# Patient Record
Sex: Female | Born: 1945 | Race: White | Hispanic: No | Smoking: Former smoker
Health system: Southern US, Community
[De-identification: ages and names within clinical notes are randomized; demographics above are authoritative.]

## PROBLEM LIST (undated history)

## (undated) DIAGNOSIS — I10 Essential (primary) hypertension: Secondary | ICD-10-CM

## (undated) DIAGNOSIS — I4891 Unspecified atrial fibrillation: Secondary | ICD-10-CM

## (undated) DIAGNOSIS — F32A Depression, unspecified: Secondary | ICD-10-CM

## (undated) DIAGNOSIS — F1010 Alcohol abuse, uncomplicated: Secondary | ICD-10-CM

## (undated) DIAGNOSIS — I6523 Occlusion and stenosis of bilateral carotid arteries: Secondary | ICD-10-CM

## (undated) DIAGNOSIS — I509 Heart failure, unspecified: Secondary | ICD-10-CM

## (undated) DIAGNOSIS — J449 Chronic obstructive pulmonary disease, unspecified: Secondary | ICD-10-CM

## (undated) DIAGNOSIS — E039 Hypothyroidism, unspecified: Secondary | ICD-10-CM

## (undated) DIAGNOSIS — F329 Major depressive disorder, single episode, unspecified: Secondary | ICD-10-CM

## (undated) DIAGNOSIS — E785 Hyperlipidemia, unspecified: Secondary | ICD-10-CM

## (undated) HISTORY — DX: Unspecified atrial fibrillation: I48.91

## (undated) HISTORY — DX: Alcohol abuse, uncomplicated: F10.10

## (undated) HISTORY — DX: Hyperlipidemia, unspecified: E78.5

## (undated) HISTORY — DX: Essential (primary) hypertension: I10

## (undated) HISTORY — DX: Depression, unspecified: F32.A

## (undated) HISTORY — DX: Occlusion and stenosis of bilateral carotid arteries: I65.23

## (undated) HISTORY — DX: Major depressive disorder, single episode, unspecified: F32.9

## (undated) HISTORY — DX: Hypothyroidism, unspecified: E03.9

## (undated) HISTORY — DX: Heart failure, unspecified: I50.9

---

## 2018-05-16 DIAGNOSIS — F32A Depression, unspecified: Secondary | ICD-10-CM | POA: Insufficient documentation

## 2018-05-16 DIAGNOSIS — I482 Chronic atrial fibrillation, unspecified: Secondary | ICD-10-CM | POA: Insufficient documentation

## 2018-05-16 DIAGNOSIS — E782 Mixed hyperlipidemia: Secondary | ICD-10-CM | POA: Insufficient documentation

## 2018-05-16 DIAGNOSIS — F1010 Alcohol abuse, uncomplicated: Secondary | ICD-10-CM | POA: Insufficient documentation

## 2018-05-16 DIAGNOSIS — E039 Hypothyroidism, unspecified: Secondary | ICD-10-CM | POA: Insufficient documentation

## 2018-05-16 DIAGNOSIS — I6523 Occlusion and stenosis of bilateral carotid arteries: Secondary | ICD-10-CM | POA: Insufficient documentation

## 2018-05-16 DIAGNOSIS — I10 Essential (primary) hypertension: Secondary | ICD-10-CM | POA: Insufficient documentation

## 2018-05-16 DIAGNOSIS — F329 Major depressive disorder, single episode, unspecified: Secondary | ICD-10-CM | POA: Insufficient documentation

## 2018-05-16 DIAGNOSIS — I5022 Chronic systolic (congestive) heart failure: Secondary | ICD-10-CM | POA: Insufficient documentation

## 2018-06-17 ENCOUNTER — Ambulatory Visit: Payer: Self-pay | Admitting: Family Medicine

## 2018-08-21 DIAGNOSIS — Z9889 Other specified postprocedural states: Secondary | ICD-10-CM | POA: Insufficient documentation

## 2018-08-21 DIAGNOSIS — R0602 Shortness of breath: Secondary | ICD-10-CM | POA: Insufficient documentation

## 2018-11-04 NOTE — Progress Notes (Addendum)
[facility] [place] [facility] Consultation      Assessment and Plan:  COPD.  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  There is some degree of reversibility as well. - Not using her nebulizers as prescribed, recommended that she use budesonide nebs twice daily, duo nebs 3 times daily.  Dypsnea on exertion.  - Multifactorial due to COPD, heart failure, obesity, deconditioning. - We will refer to pulmonary rehab.  Chronic hypoxic respiratory failure.  - Currently on oxygen. - Patient has baseline oxygen saturation of about 89% with significant desats on ambulation, continue ambulatory oxygen.  She Artie has her own antigen machine, and is not interested in starting oxygen through home care company due to the cost.  Excessive daytime sleepiness.  -Symptoms and signs of obstructive sleep apnea, and atrial fibrillation, essential hypertension. - We will send for sleep study.   Orders Placed This Encounter  Procedures  . AMB referral to pulmonary rehabilitation  . Split night study   Return in about 3 months (around 02/04/2019).   Date: 11/05/2018  MRN# [id number] Christina Lozano 06-May-1946  Referring Physician: Self referral for dyspnea.   Christina Lozano is a 72 y.o. old female seen in consultation for chief complaint of:    Chief Complaint  Patient presents with  . Consult    Former Dr.Fleming patient:  . Shortness of Breath    pt c/o sob for the last 5 days. She is on 2 liter 02 qhs. She has had to use 02 more the last week with exertion.  . Cough    non productive    HPI:    The patient is a 72 year old female, ex-smoker.  She saw Dr. Meredeth Ide in the office on 08/21/2018.  It was noted that she had flown to [place] for a wedding, while there she became short of breath went to the ED, and was told that she was having a COPD exacerbation.  Upon return she was admitted to the hospital again with pulmonary edema, status post cardiac catheterization with  no coronary artery disease noted. She sees Dr. Liborio Nixon at [facility] she has a history of heart failure, atrial fibrillation, sick sinus syndrome.  She was recommended to be on diuretics, low-sodium diet. She had a PFT which showed moderately severe COPD with FEV1 of 54%, however this improved to 68% with bronchodilator.  She is here today because she has been having difficulty with breathing, she is oxygen at night and suspect that she needs it during the day. She was diagnosed about 6-7 years ago when she was having trouble breathing. She was started on inhalers back then, and she was started on oxygen at night. She used to smoke, 2 ppd, last smoked about 12 years ago.  She has noticed that over the past 5 days that her breathing has been getting worse, her son is present and notes that she is always winded. When goes shopping she check her oxygen because of dyspnea. She is using nebs, but often forgets, she does duonebs 3 times per day and budesonide bid. She thinks that she helps with breathing somewhat.  She was prescribed Anoro but it was too expensive.   She is sleepy much of the day and naps daily. She snores loudly. She can sleep for up to 12 hours and still feels sleepy.   **desat walk 11/05/18>> at rest on RA sat is 87% and HR 81.    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  She also has moderate to severe COPD with obesity. **Review of outside PFT 06/11/2018 >>FVC is 76% predicted, FEV1 is 54% predicted, ratio is 55%.  There is significant improvement with bronchodilator therapy in the FEV1.  Vital capacity is 70%, TLC 75%, ratio is within normal limits.  DLCO is reduced at 53%.  Overall this shows moderate emphysema with reversibility and restrictive lung defect. **Outside Echocardiogram 06/09/2018>> moderate LVH with EF 50%. Total time spent review of non-face to face  documents is 35 min.    Review of outside lab results Gavin Potters([facility]). **Chemistry panel 05/16/2018>> CO2 35. **CBC 05/16/2018>> absolute eosinophil count 340. **SPECT 06/12/2018>> EF equals 57%. **Echo cardiogram>> 06/09/2018>> EF equals 50%.  RVSP 35. **PFT 06/11/2018>> Diagnostics: SPIROMETRY: FVC was 1.95 liters, 76% of predicted/Post 1.89, 74%, -3% Change FEV1 was 1.08, 54% of predicted/Post 1.36, 68%, 26% Change FEV1 ratio was 55/Post 72 FEF 25-75% liters per second was 19% of predicted/Post 41%, 116% Change SMALL VOLUME NEBULIZER given with 2.5 mg Albuterol for Post Spirometry  LUNG VOLUMES: TLC was 75% of predicted RV was 74% of predicted  DIFFUSION CAPACITY: DLCO was 53% of predicted DLCO/VA was 100% of predicted  FLOW VOLUME LOOP: C/w obstruction  Impression Spirometry is c/w moderate - severe copd/obstruction TLC is mildly decreased DLCO is moderate - severely decreased     PMHX:   Atrial Fibrillation, essential hypertension.  Surgical Hx:  Oophorectomy, left CEA.  Family Hx:  Mother with COPD, alcoholism. Biological father not known.  Social Hx:   Social History   Tobacco Use  . Smoking status: Former Smoker    Packs/day: 1.50    Years: 25.00    Pack years: 37.50    Last attempt to quit: 01/03/2006    Years since quitting: 12.8  . Smokeless tobacco: Never Used  Substance Use Topics  . Alcohol use: Yes  . Drug use: Never   Medication:    Current Outpatient Medications:  .  albuterol (PROVENTIL HFA;VENTOLIN HFA) 108 (90 Base) MCG/ACT inhaler, Inhale 2 puffs into the lungs as needed., Disp: , Rfl:  .  apixaban (ELIQUIS) 5 MG TABS tablet, Take 5 mg by mouth 2 (two) times daily., Disp: , Rfl:  .  budesonide (PULMICORT) 0.25 MG/2ML nebulizer solution, Inhale 2 mLs into the lungs 2 (two) times daily., Disp: , Rfl:  .  digoxin (LANOXIN) 0.125 MG tablet, Take 1 tablet by mouth daily., Disp: , Rfl: 11 .  diltiazem (CARDIZEM CD) 300 MG 24 hr capsule, Take  300 mg by mouth daily., Disp: , Rfl: 2 .  furosemide (LASIX) 40 MG tablet, Take 40 mg by mouth as needed., Disp: , Rfl:  .  hydrALAZINE (APRESOLINE) 25 MG tablet, Take 25 mg by mouth daily., Disp: , Rfl: 2 .  ipratropium-albuterol (DUONEB) 0.5-2.5 (3) MG/3ML SOLN, Inhale 3 mLs into the lungs 4 (four) times daily., Disp: , Rfl:  .  losartan (COZAAR) 100 MG tablet, Take 100 mg by mouth daily., Disp: , Rfl:  .  Magnesium Oxide 400 MG CAPS, Take 400 mg by mouth daily., Disp: , Rfl: 1 .  montelukast (SINGULAIR) 10 MG tablet, Take 10 mg by mouth daily., Disp: , Rfl:  .  potassium chloride (K-DUR) 10 MEQ tablet, Take 10 mEq by mouth 2 (two) times daily., Disp: , Rfl:  .  pravastatin (PRAVACHOL) 10 MG tablet, Take 10 mg by mouth at bedtime., Disp: , Rfl:  .  sertraline (ZOLOFT)  50 MG tablet, Take 50 mg by mouth daily., Disp: , Rfl:  .  Thyroid (NATURE-THROID) 48.75 MG TABS, Take 1 tablet by mouth daily., Disp: , Rfl:    Allergies:  Patient has no known allergies.  Review of Systems: Gen:  Denies  fever, sweats, chills HEENT: Denies blurred vision, double vision. bleeds, sore throat Cvc:  No dizziness, chest pain. Resp:   Denies cough or sputum production, shortness of breath Gi: Denies swallowing difficulty, stomach pain. Gu:  Denies bladder incontinence, burning urine Ext:   No Joint pain, stiffness. Skin: No skin rash,  hives  Endoc:  No polyuria, polydipsia. Psych: No depression, insomnia. Other:  All other systems were reviewed with the patient and were negative other that what is mentioned in the HPI.   Physical Examination:   VS: BP 118/76 (BP Location: Left Arm, Cuff Size: Large)   Pulse 95   Resp 16   Ht 5' 0.4" (1.534 m)   Wt 188 lb (85.3 kg)   SpO2 93%   BMI 36.23 kg/m   General Appearance: No distress  Neuro:without focal findings,  speech normal,  HEENT: PERRLA, EOM intact.   Pulmonary: normal breath sounds, No wheezing.  CardiovascularNormal S1,S2.  No m/r/g.     Abdomen: Benign, Soft, non-tender. Renal:  No costovertebral tenderness  GU:  No performed at this time. Endoc: No evident thyromegaly, no signs of acromegaly. Skin:   warm, no rashes, no ecchymosis  Extremities: normal, no cyanosis, clubbing.  Other findings:    LABORATORY PANEL:   CBC No results for input(s): WBC, HGB, HCT, PLT in the last 168 hours. ------------------------------------------------------------------------------------------------------------------  Chemistries  No results for input(s): NA, K, CL, CO2, GLUCOSE, BUN, CREATININE, CALCIUM, MG, AST, ALT, ALKPHOS, BILITOT in the last 168 hours.  Invalid input(s): GFRCGP ------------------------------------------------------------------------------------------------------------------  Cardiac Enzymes No results for input(s): TROPONINI in the last 168 hours. ------------------------------------------------------------  RADIOLOGY:  No results found.     Thank  you for the consultation and for allowing [facility] to assist in the care of your patient. Our recommendations are noted above.  Please contact us if we can be of further service.   Wells Guiles, M.D., F.C.C.P.  Board Certified in Internal Medicine, Pulmonary Medicine, Critical Care Medicine, and Sleep Medicine.  [facility] Office Number: [phone]   11/05/2018

## 2018-11-05 ENCOUNTER — Encounter: Payer: Self-pay | Admitting: Internal Medicine

## 2018-11-05 ENCOUNTER — Ambulatory Visit (INDEPENDENT_AMBULATORY_CARE_PROVIDER_SITE_OTHER): Payer: Federal, State, Local not specified - PPO | Admitting: Internal Medicine

## 2018-11-05 VITALS — BP 118/76 | HR 95 | Resp 16 | Ht 60.4 in | Wt 188.0 lb

## 2018-11-05 DIAGNOSIS — J449 Chronic obstructive pulmonary disease, unspecified: Secondary | ICD-10-CM

## 2018-11-05 DIAGNOSIS — G4719 Other hypersomnia: Secondary | ICD-10-CM | POA: Diagnosis not present

## 2018-11-05 NOTE — Patient Instructions (Addendum)
Remember to take your nebulizer as prescribed.  Continue your own daytime oxygen. Call us if you need a prescription.   Will refer to pulmonary rehab.  Will send for sleep study.

## 2018-11-06 ENCOUNTER — Other Ambulatory Visit: Payer: Self-pay

## 2018-11-06 ENCOUNTER — Telehealth: Payer: Self-pay | Admitting: Internal Medicine

## 2018-11-06 MED ORDER — UMECLIDINIUM-VILANTEROL 62.5-25 MCG/INH IN AEPB: 1.0000 | INHALATION_SPRAY | Freq: Every day | RESPIRATORY_TRACT | 2 refills | Status: AC

## 2018-11-06 NOTE — Telephone Encounter (Signed)
Pt wanted Anoro sent to [facility]. She says it will be cheaper. RX sent Nothing further needed.

## 2018-11-06 NOTE — Telephone Encounter (Signed)
LMTCB  Pt will need to contact her insurance to find out what medication is covered alternative to Anoro. She can then call office back and a different inhaler will be sent to pharmacy.

## 2018-11-06 NOTE — Telephone Encounter (Signed)
Please call regarding an alternative for Anoro. States her insurance is not going to cover it.

## 2018-11-12 ENCOUNTER — Telehealth: Payer: Self-pay | Admitting: Internal Medicine

## 2018-11-12 NOTE — Telephone Encounter (Signed)
Faxed [facility] records to [facility] 11/12/18  LM

## 2018-11-20 NOTE — Telephone Encounter (Signed)
Asher MuirJamie called [facility] in reference to medical records sent over 11/12/18 and wants to know which form of medicine they are intended to be sent to, Cad[facility], Pulm, etc. CB # [phone]

## 2018-11-20 NOTE — Telephone Encounter (Signed)
LMTCB

## 2018-11-21 NOTE — Telephone Encounter (Signed)
Left detailed message for Asher MuirJamie that we have not requested records for this patient. If anything further needed she may call our office. Message closed.

## 2018-11-27 ENCOUNTER — Encounter (HOSPITAL_COMMUNITY): Payer: Self-pay | Admitting: Emergency Medicine

## 2018-11-27 ENCOUNTER — Other Ambulatory Visit: Payer: Self-pay

## 2018-11-27 ENCOUNTER — Inpatient Hospital Stay (HOSPITAL_COMMUNITY)
Admission: EM | Admit: 2018-11-27 | Discharge: 2018-11-28 | DRG: 292 | Disposition: A | Discharge status: Home / Self Care | Payer: Medicare Other | Attending: Student in an Organized Health Care Education/Training Program | Admitting: Student in an Organized Health Care Education/Training Program

## 2018-11-27 ENCOUNTER — Emergency Department (HOSPITAL_COMMUNITY): Payer: Medicare Other

## 2018-11-27 DIAGNOSIS — I509 Heart failure, unspecified: Secondary | ICD-10-CM

## 2018-11-27 DIAGNOSIS — Z7901 Long term (current) use of anticoagulants: Secondary | ICD-10-CM | POA: Diagnosis not present

## 2018-11-27 DIAGNOSIS — E039 Hypothyroidism, unspecified: Secondary | ICD-10-CM

## 2018-11-27 DIAGNOSIS — F1010 Alcohol abuse, uncomplicated: Secondary | ICD-10-CM | POA: Diagnosis present

## 2018-11-27 DIAGNOSIS — I5033 Acute on chronic diastolic (congestive) heart failure: Secondary | ICD-10-CM | POA: Diagnosis present

## 2018-11-27 DIAGNOSIS — F339 Major depressive disorder, recurrent, unspecified: Secondary | ICD-10-CM | POA: Diagnosis not present

## 2018-11-27 DIAGNOSIS — Z87891 Personal history of nicotine dependence: Secondary | ICD-10-CM

## 2018-11-27 DIAGNOSIS — F329 Major depressive disorder, single episode, unspecified: Secondary | ICD-10-CM | POA: Diagnosis present

## 2018-11-27 DIAGNOSIS — J9611 Chronic respiratory failure with hypoxia: Secondary | ICD-10-CM | POA: Diagnosis present

## 2018-11-27 DIAGNOSIS — I6523 Occlusion and stenosis of bilateral carotid arteries: Secondary | ICD-10-CM | POA: Diagnosis present

## 2018-11-27 DIAGNOSIS — J449 Chronic obstructive pulmonary disease, unspecified: Secondary | ICD-10-CM | POA: Diagnosis present

## 2018-11-27 DIAGNOSIS — R0602 Shortness of breath: Secondary | ICD-10-CM | POA: Diagnosis not present

## 2018-11-27 DIAGNOSIS — Z9981 Dependence on supplemental oxygen: Secondary | ICD-10-CM

## 2018-11-27 DIAGNOSIS — R7989 Other specified abnormal findings of blood chemistry: Secondary | ICD-10-CM | POA: Diagnosis not present

## 2018-11-27 DIAGNOSIS — Z7951 Long term (current) use of inhaled steroids: Secondary | ICD-10-CM

## 2018-11-27 DIAGNOSIS — R778 Other specified abnormalities of plasma proteins: Secondary | ICD-10-CM | POA: Diagnosis present

## 2018-11-27 DIAGNOSIS — I110 Hypertensive heart disease with heart failure: Secondary | ICD-10-CM | POA: Diagnosis not present

## 2018-11-27 DIAGNOSIS — Z79899 Other long term (current) drug therapy: Secondary | ICD-10-CM | POA: Diagnosis not present

## 2018-11-27 DIAGNOSIS — E785 Hyperlipidemia, unspecified: Secondary | ICD-10-CM | POA: Diagnosis present

## 2018-11-27 DIAGNOSIS — Z9114 Patient's other noncompliance with medication regimen: Secondary | ICD-10-CM

## 2018-11-27 DIAGNOSIS — Z8701 Personal history of pneumonia (recurrent): Secondary | ICD-10-CM | POA: Diagnosis not present

## 2018-11-27 DIAGNOSIS — I482 Chronic atrial fibrillation, unspecified: Secondary | ICD-10-CM | POA: Diagnosis present

## 2018-11-27 HISTORY — DX: Chronic obstructive pulmonary disease, unspecified: J44.9

## 2018-11-27 LAB — URINALYSIS, ROUTINE W REFLEX MICROSCOPIC
Bilirubin Urine: NEGATIVE
Glucose, UA: NEGATIVE mg/dL
HGB URINE DIPSTICK: NEGATIVE
Ketones, ur: NEGATIVE mg/dL
Leukocytes, UA: NEGATIVE
Nitrite: NEGATIVE
Protein, ur: NEGATIVE mg/dL
Specific Gravity, Urine: 1.004 — ABNORMAL LOW (ref 1.005–1.030)
pH: 7 (ref 5.0–8.0)

## 2018-11-27 LAB — COMPREHENSIVE METABOLIC PANEL
ALK PHOS: 66 U/L (ref 38–126)
ALT: 22 U/L (ref 0–44)
AST: 28 U/L (ref 15–41)
Albumin: 3.7 g/dL (ref 3.5–5.0)
Anion gap: 5 (ref 5–15)
BUN: 19 mg/dL (ref 8–23)
CALCIUM: 9.6 mg/dL (ref 8.9–10.3)
CO2: 33 mmol/L — AB (ref 22–32)
Chloride: 102 mmol/L (ref 98–111)
Creatinine, Ser: 0.89 mg/dL (ref 0.44–1.00)
GFR calc Af Amer: >60 mL/min (ref >60)
GFR calc non Af Amer: >60 mL/min (ref >60)
Glucose, Bld: 90 mg/dL (ref 70–99)
Potassium: 4 mmol/L (ref 3.5–5.1)
Sodium: 140 mmol/L (ref 135–145)
Total Bilirubin: 0.5 mg/dL (ref 0.3–1.2)
Total Protein: 7.1 g/dL (ref 6.5–8.1)

## 2018-11-27 LAB — CBC WITH DIFFERENTIAL/PLATELET
Abs Immature Granulocytes: 0.04 10*3/uL (ref 0.00–0.07)
Basophils Absolute: 0.1 10*3/uL (ref 0.0–0.1)
Basophils Relative: 1 %
Eosinophils Absolute: 0.5 10*3/uL (ref 0.0–0.5)
Eosinophils Relative: 4 %
HCT: 38.7 % (ref 36.0–46.0)
Hemoglobin: 12 g/dL (ref 12.0–15.0)
Immature Granulocytes: 0 %
Lymphocytes Relative: 14 %
Lymphs Abs: 1.6 10*3/uL (ref 0.7–4.0)
MCH: 29.6 pg (ref 26.0–34.0)
MCHC: 31 g/dL (ref 30.0–36.0)
MCV: 95.6 fL (ref 80.0–100.0)
Monocytes Absolute: 1 10*3/uL (ref 0.1–1.0)
Monocytes Relative: 9 %
Neutro Abs: 8 10*3/uL — ABNORMAL HIGH (ref 1.7–7.7)
Neutrophils Relative %: 72 %
Platelets: 360 10*3/uL (ref 150–400)
RBC: 4.05 MIL/uL (ref 3.87–5.11)
RDW: 13.8 % (ref 11.5–15.5)
WBC: 11.2 10*3/uL — ABNORMAL HIGH (ref 4.0–10.5)
nRBC: 0 % (ref 0.0–0.2)

## 2018-11-27 LAB — TROPONIN I: Troponin I: 0.12 ng/mL (ref <0.03)

## 2018-11-27 LAB — BRAIN NATRIURETIC PEPTIDE: B Natriuretic Peptide: 225.3 pg/mL — ABNORMAL HIGH (ref 0.0–100.0)

## 2018-11-27 MED ORDER — SODIUM CHLORIDE 0.9% FLUSH
3.0000 mL | Freq: Two times a day (BID) | INTRAVENOUS | Status: DC
  Administered 2018-11-28: 3 mL via INTRAVENOUS

## 2018-11-27 MED ORDER — FUROSEMIDE 10 MG/ML IJ SOLN
40.0000 mg | Freq: Two times a day (BID) | INTRAMUSCULAR | Status: DC
  Administered 2018-11-28: 40 mg via INTRAVENOUS
  Filled 2018-11-27: qty 4

## 2018-11-27 MED ORDER — ACETAMINOPHEN 650 MG RE SUPP: 650.0000 mg | Freq: Four times a day (QID) | RECTAL | Status: DC | PRN

## 2018-11-27 MED ORDER — MONTELUKAST SODIUM 10 MG PO TABS
10.0000 mg | ORAL_TABLET | Freq: Every day | ORAL | Status: DC
  Administered 2018-11-28: 10 mg via ORAL
  Filled 2018-11-27: qty 1

## 2018-11-27 MED ORDER — APIXABAN 5 MG PO TABS
5.0000 mg | ORAL_TABLET | Freq: Two times a day (BID) | ORAL | Status: DC
  Administered 2018-11-28 (×2): 5 mg via ORAL
  Filled 2018-11-27 (×2): qty 1

## 2018-11-27 MED ORDER — HYDRALAZINE HCL 25 MG PO TABS
25.0000 mg | ORAL_TABLET | Freq: Three times a day (TID) | ORAL | Status: DC
  Administered 2018-11-28 (×3): 25 mg via ORAL
  Filled 2018-11-27 (×3): qty 1

## 2018-11-27 MED ORDER — MAGNESIUM OXIDE 400 (241.3 MG) MG PO TABS
400.0000 mg | ORAL_TABLET | Freq: Every day | ORAL | Status: DC
  Administered 2018-11-28: 400 mg via ORAL
  Filled 2018-11-27: qty 1

## 2018-11-27 MED ORDER — UMECLIDINIUM-VILANTEROL 62.5-25 MCG/INH IN AEPB
1.0000 | INHALATION_SPRAY | Freq: Every day | RESPIRATORY_TRACT | Status: DC
  Administered 2018-11-28: 1 via RESPIRATORY_TRACT
  Filled 2018-11-27: qty 14

## 2018-11-27 MED ORDER — MAGNESIUM OXIDE 400 MG PO CAPS: 400.0000 mg | ORAL_CAPSULE | Freq: Every day | ORAL | Status: DC

## 2018-11-27 MED ORDER — SODIUM CHLORIDE 0.9% FLUSH
3.0000 mL | Freq: Two times a day (BID) | INTRAVENOUS | Status: DC
  Administered 2018-11-28 (×2): 3 mL via INTRAVENOUS

## 2018-11-27 MED ORDER — SENNOSIDES-DOCUSATE SODIUM 8.6-50 MG PO TABS: 1.0000 | ORAL_TABLET | Freq: Every evening | ORAL | Status: DC | PRN

## 2018-11-27 MED ORDER — SODIUM CHLORIDE 0.9% FLUSH: 3.0000 mL | INTRAVENOUS | Status: DC | PRN

## 2018-11-27 MED ORDER — SERTRALINE HCL 50 MG PO TABS
50.0000 mg | ORAL_TABLET | Freq: Every day | ORAL | Status: DC
  Administered 2018-11-28: 50 mg via ORAL
  Filled 2018-11-27: qty 1

## 2018-11-27 MED ORDER — PRAVASTATIN SODIUM 10 MG PO TABS
10.0000 mg | ORAL_TABLET | Freq: Every day | ORAL | Status: DC
  Administered 2018-11-28: 10 mg via ORAL
  Filled 2018-11-27: qty 1

## 2018-11-27 MED ORDER — DILTIAZEM HCL ER COATED BEADS 300 MG PO CP24
300.0000 mg | ORAL_CAPSULE | Freq: Every day | ORAL | Status: DC
  Administered 2018-11-28: 300 mg via ORAL
  Filled 2018-11-27: qty 1

## 2018-11-27 MED ORDER — IPRATROPIUM-ALBUTEROL 0.5-2.5 (3) MG/3ML IN SOLN: 3.0000 mL | RESPIRATORY_TRACT | Status: DC | PRN

## 2018-11-27 MED ORDER — DIGOXIN 125 MCG PO TABS
125.0000 ug | ORAL_TABLET | Freq: Every day | ORAL | Status: DC
  Administered 2018-11-28: 125 ug via ORAL
  Filled 2018-11-27: qty 1

## 2018-11-27 MED ORDER — POTASSIUM CHLORIDE CRYS ER 10 MEQ PO TBCR
10.0000 meq | EXTENDED_RELEASE_TABLET | Freq: Every day | ORAL | Status: DC
  Administered 2018-11-28: 10 meq via ORAL
  Filled 2018-11-27 (×2): qty 1

## 2018-11-27 MED ORDER — SODIUM CHLORIDE 0.9 % IV SOLN: 250.0000 mL | INTRAVENOUS | Status: DC | PRN

## 2018-11-27 MED ORDER — ACETAMINOPHEN 325 MG PO TABS: 650.0000 mg | ORAL_TABLET | Freq: Four times a day (QID) | ORAL | Status: DC | PRN

## 2018-11-27 MED ORDER — FUROSEMIDE 10 MG/ML IJ SOLN
40.0000 mg | Freq: Once | INTRAMUSCULAR | Status: AC
  Administered 2018-11-27: 40 mg via INTRAVENOUS
  Filled 2018-11-27: qty 4

## 2018-11-27 NOTE — H&P (Addendum)
Date: 11/27/2018               Patient Name:  Christina Lozano MRN: [id number]  DOB: 27-Dec-1945 Age / Sex: 72 y.o., female   PCP: Gracelyn Nurse, MD         Medical Service: Internal Medicine Teaching Service         Attending Physician: Dr. Oswaldo Done, Marquita Palms, *    First Contact: Dr. Gwyneth Revels Pager: [phone]  Second Contact: Dr. Frances Furbish Pager: [phone]       After Hours (After 5p/  First Contact Pager: [phone]  weekends / holidays): Second Contact Pager: [phone]   Chief Complaint: Shortness of breath  History of Present Illness: 72 year old female with past medical history of COPD on home oxygen at night, CHF (last echo 7.8 2019) EF: 50%, partial adherence to PRN Lasix), HLD, HTN, hypothyroidism, bilateral carotid artery stenosis, A. Fib (on Eliquis 5 mg daily), presented to ED due to shortness of breath. She mentions that she was hospitalized in [place] 2 weeks ago due to pneumonia, treated with antibiotics. How ever she had gradual worsening of shortness of breath and required O2 during the days sometimes since then. She reports more DOE on the day of admission and when walking to the kitchen and her son brought her to th DE. She denies chest pain. No cough, fever or chills. She endorses bilateral lower extremity edema in past 2 days. No orthopnea. No weight gain. She has been prescribed 40 mg PRN Lasix but usually hesitates to take it due to inconvenience of having to use the bathroom. She is otherwise compliant to her medications and says she has been on low sodium diet. ROS is negative for abdominal pain, diarrhea, constipation, dysuria.  ED course: CXR showed some vascular congestion and she found to have elevated BNP at 225. Also had mild Tropononinemia at 0.12 and EKG showed T inversion on inferolateral leads (and afib). She was given 40 mg IV Lasix. Remained stable and her SOB improved, saturated well on 2 lit nasal O2. IM teaching service then consulted for admission.    Meds:  Current Meds  Medication Sig  . albuterol (PROVENTIL HFA;VENTOLIN HFA) 108 (90 Base) MCG/ACT inhaler Inhale 2 puffs into the lungs every 4 (four) hours as needed for wheezing or shortness of breath.   Marland Kitchen apixaban (ELIQUIS) 5 MG TABS tablet Take 5 mg by mouth 2 (two) times daily.  . budesonide (PULMICORT) 0.25 MG/2ML nebulizer solution Inhale 2 mLs into the lungs 2 (two) times daily as needed (shortness of breath).   . digoxin (LANOXIN) 0.125 MG tablet Take 1 tablet by mouth daily.  Marland Kitchen diltiazem (CARDIZEM CD) 300 MG 24 hr capsule Take 300 mg by mouth daily.  . furosemide (LASIX) 40 MG tablet Take 40 mg by mouth daily.   . hydrALAZINE (APRESOLINE) 25 MG tablet Take 25 mg by mouth every 8 (eight) hours.   Marland Kitchen ipratropium-albuterol (DUONEB) 0.5-2.5 (3) MG/3ML SOLN Inhale 3 mLs into the lungs every 4 (four) hours as needed (short of breath).   . Magnesium Oxide 400 MG CAPS Take 400 mg by mouth daily.  . montelukast (SINGULAIR) 10 MG tablet Take 10 mg by mouth daily.  . potassium chloride (K-DUR) 10 MEQ tablet Take 10 mEq by mouth daily.   . pravastatin (PRAVACHOL) 10 MG tablet Take 10 mg by mouth at bedtime.  . sertraline (ZOLOFT) 50 MG tablet Take 50 mg by mouth at bedtime.   . Thyroid (NATURE-THROID) 48.75  MG TABS Take 1 tablet by mouth daily.  Marland Kitchen. umeclidinium-vilanterol (ANORO ELLIPTA) 62.5-25 MCG/INH AEPB Inhale 1 puff into the lungs daily.     Allergies: Allergies as of 11/27/2018  . (No Known Allergies)   Past Medical History:  Diagnosis Date  . A-fib (HCC)   . Alcohol abuse   . Bilateral carotid artery stenosis   . CHF (congestive heart failure) (HCC)   . COPD (chronic obstructive pulmonary disease) (HCC)   . Depression   . Hyperlipidemia   . Hypertension   . Hypothyroidism     Family History: Mother had lung cancer   Social History:  Former smoker (50 pack year). Quit 14 years ago. Alcohol use: Drinks 2-3 wines per night. No Hx of withdrawal.  Review of  Systems: A complete ROS was negative except as per HPI.   Physical Exam: Blood pressure (!) 153/86, pulse 69, temperature (!) 97.2 F (36.2 C), temperature source Oral, resp. rate 18, height 5\' 3"  (1.6 m), weight 81.6 kg, SpO2 96 %. Physical Exam Nursing note reviewed.  Constitutional:      General: She is not in acute distress.    Appearance: She is well-developed. She is not ill-appearing.  HENT:     Head: Normocephalic and atraumatic.     Mouth/Throat:     Mouth: Mucous membranes are moist.  Eyes:     Extraocular Movements: Extraocular movements intact.     Pupils: Pupils are equal, round, and reactive to light.  Neck:     Vascular: No JVD.  Cardiovascular:     Rate and Rhythm: Normal rate. Rhythm irregular.     Heart sounds: No murmur.   Pulmonary:     Effort: Pulmonary effort is normal. No tachypnea or respiratory distress.     Breath sounds: Examination of the left-lower field reveals Rales  Abdominal:     General: Bowel sounds are normal.     Palpations: Abdomen is soft.     Tenderness: There is no abdominal tenderness.   Musculoskeletal:   Bilateral 1+ pitting Edema till mid tibia present.   Skin:    General: Skin is warm.     Findings: No rash.  Neurological:     General: No focal deficit present.     Mental Status: She is alert and oriented to person, place, and time.  Psychiatric:        Mood and Affect: Mood normal.        Behavior: Behavior normal.     BNP: 225.3 Trop: 0.12 CMP, CBC and U/A unremarkable.   EKG: personally reviewed my interpretation is A-fib, T inversion in Inf lat leads.  CXR: personally reviewed my interpretation is vascular congestion with probable effusion. without pulmonary edema.   Assessment & Plan by Problem: Active Problems:   CHF exacerbation (HCC)  Acute on chronic congestive heart failure: Her gradually worsening SOB in pasr 2 weeks and 2 days Hx of LEE, with evidence of vascular congestion on CXR, and BNP elevation  is likely 2/2 acute on chronic CHF exacerbation. Report partial/non adherence to PRN Lasix.   Non ischemic HF NYHA class 2 last echo 7.8 119, EF: 50%. Myocardial perfusion test 06/09/2018: Nl myocardial perfusion without evidence of myocardial ischemia)  Patient also has Tropononinemia and T inversion on inferolateral leads. ED provider talked to Dr. Delton SeeNelson and he recommended admission for diuresis and  and possible echo and re consult as needed.  Can be due to demand ischemia. Will trend troponin and monitor patient while  diuresing her.  -IV Lasix 40 mg twice daily -Potassium chloride 10 mEq p.o. daily -Strict intake output -Daily weights -Echocardiogram -Trend troponin every 6 hours -Cardiac monitoring and continious pulse oximetry -BMP next a.m. -CBC next a.m. -Administere 2 li  nasal O2 to keep O2 sat between 88-92% during the day and  dminister 3 lit nasal O2 at night.  COPD: Moderate to sever COPD. No evidence of current COPD exacerbation.  Patient is on Pulmicort twice daily, Montelukast 10 mg daily, Anoro Ellipta daily, Albuterol inhaler as needed, DuoNeb every 4 hours as needed.  Also uses night oxygen at home. (Per chart, has not been consistent with home O2 and inhalers.)  Last SPIROMETRY: 06/11/2018: FVC: 76% of predicted/  Post inhaler:74% FEV1: 54% of predicted/ Post inhaler: 68% FEV1 ratio was 55/Post 72  -Montelukast 10 mg tablet daily -Anoro Ellipta 62.5-25 1 puff daily -DuoNeb nebulizer 0.5-2.5 every 4 hours PRN -Administere 2 li  nasal O2 to keep O2 sat between 88-92% during the day. Administer 3 lit nasal O2 at night.   Afib: Denies any worsening of palpitation. Currently A fib, with controlled rate. Has been on Eliquis 5 mg BID and digoxin 125 MCG daily at home.  -Continue home dose of Eliquis 5 mg twice daily -Continue home dose of Digoxin 125 MCG, daily -Cardiac Monitoring  HTN: BP 150s-160s/70s-80s.   We will continue home meds: -Hydralazine 25 mg  p.o. every 8 hours -Diltiazem 300 mg daily  HLD: Bilateral Carotid stenosis: Asymptomatic -Continue home med: Paravastain 10 mg QD  Depression: Stable -Continue home med: Zoloft 50 mg at bedtime  Hypothyroidism: Last TSH normal at 2.13 six months ago. Not on meds.   Diet: Heart healthy IV fluid: None VTE ppx: Eliquis Code status: Full code  Dispo: Admit patient to Inpatient with expected length of stay greater than 2 midnights.  SignedChevis Pretty: Burlon Centrella, MD 11/27/2018, 9:26 PM  Pager: [phone]

## 2018-11-27 NOTE — ED Provider Notes (Signed)
[facility] Note   CSN: [id number] Arrival date & time: 11/27/18  1520     History   Chief Complaint Chief Complaint  Patient presents with  . Shortness of Breath    HPI Christina Lozano is a 72 y.o. female.  HPI  Past Medical History:  Diagnosis Date  . A-fib (HCC)   . Alcohol abuse   . Bilateral carotid artery stenosis   . CHF (congestive heart failure) (HCC)   . COPD (chronic obstructive pulmonary disease) (HCC)   . Depression   . Hyperlipidemia   . Hypertension   . Hypothyroidism     Patient Active Problem List   Diagnosis Date Noted  . S/P cardiac catheterization 08/21/2018  . SOBOE (shortness of breath on exertion) 08/21/2018  . Alcohol abuse, daily use 05/16/2018  . Benign essential HTN 05/16/2018  . Bilateral carotid artery stenosis 05/16/2018  . Chronic a-fib 05/16/2018  . Chronic systolic CHF (congestive heart failure), NYHA class 2 (HCC) 05/16/2018  . Depression, prolonged 05/16/2018  . Hyperlipidemia, mixed 05/16/2018  . Hypothyroidism 05/16/2018    History reviewed. No pertinent surgical history.   OB History   No obstetric history on file.      Home Medications    Prior to Admission medications   Medication Sig Start Date End Date Taking? Authorizing Provider  albuterol (PROVENTIL HFA;VENTOLIN HFA) 108 (90 Base) MCG/ACT inhaler Inhale 2 puffs into the lungs every 4 (four) hours as needed for wheezing or shortness of breath.  11/03/18 11/03/19 Yes [provider]  apixaban (ELIQUIS) 5 MG TABS tablet Take 5 mg by mouth 2 (two) times daily. 10/29/18  Yes [provider]  budesonide (PULMICORT) 0.25 MG/2ML nebulizer solution Inhale 2 mLs into the lungs 2 (two) times daily as needed (shortness of breath).  08/12/17  Yes [provider]  digoxin (LANOXIN) 0.125 MG tablet Take 1 tablet by mouth daily. 11/03/18  Yes [provider]  diltiazem (CARDIZEM CD) 300 MG 24 hr  capsule Take 300 mg by mouth daily. 10/20/18  Yes [provider]  furosemide (LASIX) 40 MG tablet Take 40 mg by mouth daily.  05/16/18  Yes [provider]  hydrALAZINE (APRESOLINE) 25 MG tablet Take 25 mg by mouth every 8 (eight) hours.  09/20/18  Yes [provider]  ipratropium-albuterol (DUONEB) 0.5-2.5 (3) MG/3ML SOLN Inhale 3 mLs into the lungs every 4 (four) hours as needed (short of breath).  11/03/18 10/29/19 Yes [provider]  Magnesium Oxide 400 MG CAPS Take 400 mg by mouth daily. 09/30/18  Yes [provider]  montelukast (SINGULAIR) 10 MG tablet Take 10 mg by mouth daily. 09/30/18  Yes [provider]  potassium chloride (K-DUR) 10 MEQ tablet Take 10 mEq by mouth daily.  05/16/18  Yes [provider]  pravastatin (PRAVACHOL) 10 MG tablet Take 10 mg by mouth at bedtime. 05/16/18  Yes [provider]  sertraline (ZOLOFT) 50 MG tablet Take 50 mg by mouth at bedtime.  05/16/18  Yes [provider]  Thyroid (NATURE-THROID) 48.75 MG TABS Take 1 tablet by mouth daily. 05/16/18  Yes [provider]  umeclidinium-vilanterol (ANORO ELLIPTA) 62.5-25 MCG/INH AEPB Inhale 1 puff into the lungs daily. 11/06/18  Yes Shane Crutchamachandran, Pradeep, MD    Family History History reviewed. No pertinent family history.  Social History Social History   Tobacco Use  . Smoking status: Former Smoker    Packs/day: 1.50    Years: 40.00  Pack years: 60.00    Last attempt to quit: 01/03/2006    Years since quitting: 12.9  . Smokeless tobacco: Never Used  Substance Use Topics  . Alcohol use: Yes  . Drug use: Never     Allergies   Patient has no known allergies.   Review of Systems Review of Systems   Physical Exam Updated Vital Signs BP (!) 163/89 (BP Location: Left Arm)   Pulse 70   Temp (!) 97.2 F (36.2 C) (Oral)   Resp 16   Ht 5\' 3"  (1.6 m)   Wt 81.6 kg   SpO2 97%   BMI 31.89 kg/m   Physical  Exam   ED Treatments / Results  Labs (all labs ordered are listed, but only abnormal results are displayed) Labs Reviewed  CBC WITH DIFFERENTIAL/PLATELET - Abnormal; Notable for the following components:      Result Value   WBC 11.2 (*)    Neutro Abs 8.0 (*)    All other components within normal limits  COMPREHENSIVE METABOLIC PANEL - Abnormal; Notable for the following components:   CO2 33 (*)    All other components within normal limits  TROPONIN I - Abnormal; Notable for the following components:   Troponin I 0.12 (*)    All other components within normal limits  BRAIN NATRIURETIC PEPTIDE - Abnormal; Notable for the following components:   B Natriuretic Peptide 225.3 (*)    All other components within normal limits  URINALYSIS, ROUTINE W REFLEX MICROSCOPIC - Abnormal; Notable for the following components:   Color, Urine COLORLESS (*)    Specific Gravity, Urine 1.004 (*)    All other components within normal limits    EKG EKG Interpretation  Date/Time:  Thursday November 27 2018 15:25:26 EST Ventricular Rate:  82 PR Interval:    QRS Duration: 82 QT Interval:  334 QTC Calculation: 390 R Axis:   20 Text Interpretation:  Atrial fibrillation ST & T wave abnormality, consider inferior ischemia Abnormal ECG Confirmed by Loren RacerYelverton, Gennell How ([id number]) on 11/27/2018 5:12:45 PM   Radiology Dg Chest 2 View  Result Date: 11/27/2018 CLINICAL DATA:  Shortness of breath. Recently hospitalized for pneumonia. History of hypertension, COPD, congestive heart failure and atrial fibrillation. EXAM: CHEST - 2 VIEW COMPARISON:  None available. Report only [facility]. FINDINGS: Mild cardiomegaly with aortic atherosclerosis. There is vascular congestion without overt pulmonary edema or confluent airspace opacity. Both costophrenic angles are blunted posteriorly, suspicious for small bilateral pleural effusions. There are surgical clips in the left neck.  Degenerative changes are present throughout the thoracic spine. IMPRESSION: Mild cardiomegaly with vascular congestion and probable small bilateral pleural effusions. No focal airspace disease. Electronically Signed   By: Carey BullocksWilliam  Veazey M.D.   On: 11/27/2018 16:48    Procedures Procedures (including critical care time)  Medications Ordered in ED Medications  furosemide (LASIX) injection 40 mg (40 mg Intravenous Given 11/27/18 1732)     Initial Impression / Assessment and Plan / ED Course  I have reviewed the triage vital signs and the nursing notes.  Pertinent labs & imaging results that were available during my care of the patient were reviewed by me and considered in my medical decision making (see chart for details).     Patient continues to deny any chest pain.  X-ray with evidence of fluid overload as well as elevation in BNP.  Patient also has an elevated troponin.  EKG with atrial fibrillation and diffuse T wave inversion with  no old EKG for comparison.  Spoke with Dr. Delton See who recommends medicine admit for diuresis and possible echo.  Reconsult as needed.  Internal medicine service to see patient in the emergency department and admit.  Final Clinical Impressions(s) / ED Diagnoses   Final diagnoses:  Acute on chronic congestive heart failure, unspecified heart failure type [facility]  Elevated troponin    ED Discharge Orders    None       Loren Racer, MD 11/27/18 2016

## 2018-11-27 NOTE — ED Triage Notes (Signed)
Pt reports she was admitted several weeks ago for pneumonia. Pt reports she finished her antibiotics but reports she doesn't feel much better. Pt reports she typically wears 3L [place] at night but has needed O2 during the day also. Pt reports she had a cough 1 week ago but no longer has a cough.

## 2018-11-27 NOTE — ED Provider Notes (Signed)
[facility] Provider Note   CSN: [id number] Arrival date & time: 11/27/18  1520     History   Chief Complaint Chief Complaint  Patient presents with  . Shortness of Breath    HPI Christina Lozano is a 72 y.o. female.  HPI Patient has history of CHF, COPD and atrial fibrillation who wears home oxygen at night.  States 2 weeks ago she was diagnosed with pneumonia.  Since that time she has had increased dyspnea with exertion.  She has been wearing her oxygen more often.  States her cough has improved.  Denies any fever or chills.  She denies any chest pain.  She has had bilateral lower extremity swelling.  Patient states she does not take Lasix as prescribed due to inconvenience of having to use the bathroom.  She did take a dose yesterday. Past Medical History:  Diagnosis Date  . A-fib (HCC)   . Alcohol abuse   . Bilateral carotid artery stenosis   . CHF (congestive heart failure) (HCC)   . COPD (chronic obstructive pulmonary disease) (HCC)   . Depression   . Hyperlipidemia   . Hypertension   . Hypothyroidism     Patient Active Problem List   Diagnosis Date Noted  . COPD (chronic obstructive pulmonary disease) (HCC) 11/28/2018  . Chronic hypoxemic respiratory failure (HCC) 11/28/2018  . Elevated troponin 11/28/2018  . Acute on chronic diastolic heart failure (HCC) 11/27/2018  . S/P cardiac catheterization 08/21/2018  . SOBOE (shortness of breath on exertion) 08/21/2018  . Alcohol abuse, daily use 05/16/2018  . Benign essential HTN 05/16/2018  . Bilateral carotid artery stenosis 05/16/2018  . Chronic a-fib 05/16/2018  . Chronic systolic CHF (congestive heart failure), NYHA class 2 (HCC) 05/16/2018  . Depression, prolonged 05/16/2018  . Hyperlipidemia, mixed 05/16/2018  . Hypothyroidism 05/16/2018    History reviewed. No pertinent surgical history.   OB History   No obstetric history on file.      Home Medications     Prior to Admission medications   Medication Sig Start Date End Date Taking? Authorizing Provider  albuterol (PROVENTIL HFA;VENTOLIN HFA) 108 (90 Base) MCG/ACT inhaler Inhale 2 puffs into the lungs every 4 (four) hours as needed for wheezing or shortness of breath.  11/03/18 11/03/19 Yes [provider]  apixaban (ELIQUIS) 5 MG TABS tablet Take 5 mg by mouth 2 (two) times daily. 10/29/18  Yes [provider]  budesonide (PULMICORT) 0.25 MG/2ML nebulizer solution Inhale 2 mLs into the lungs 2 (two) times daily as needed (shortness of breath).  08/12/17  Yes [provider]  digoxin (LANOXIN) 0.125 MG tablet Take 1 tablet by mouth daily. 11/03/18  Yes [provider]  diltiazem (CARDIZEM CD) 300 MG 24 hr capsule Take 300 mg by mouth daily. 10/20/18  Yes [provider]  furosemide (LASIX) 40 MG tablet Take 40 mg by mouth daily.  05/16/18  Yes [provider]  hydrALAZINE (APRESOLINE) 25 MG tablet Take 25 mg by mouth every 8 (eight) hours.  09/20/18  Yes [provider]  ipratropium-albuterol (DUONEB) 0.5-2.5 (3) MG/3ML SOLN Inhale 3 mLs into the lungs every 4 (four) hours as needed (short of breath).  11/03/18 10/29/19 Yes [provider]  Magnesium Oxide 400 MG CAPS Take 400 mg by mouth daily. 09/30/18  Yes [provider]  montelukast (SINGULAIR) 10 MG tablet Take 10 mg by mouth daily. 09/30/18  Yes [provider]  potassium chloride (K-DUR) 10 MEQ tablet  Take 10 mEq by mouth daily.  05/16/18  Yes [provider]  pravastatin (PRAVACHOL) 10 MG tablet Take 10 mg by mouth at bedtime. 05/16/18  Yes [provider]  sertraline (ZOLOFT) 50 MG tablet Take 50 mg by mouth at bedtime.  05/16/18  Yes [provider]  Thyroid (NATURE-THROID) 48.75 MG TABS Take 1 tablet by mouth daily. 05/16/18  Yes [provider]  umeclidinium-vilanterol (ANORO ELLIPTA) 62.5-25 MCG/INH AEPB Inhale 1 puff into  the lungs daily. 11/06/18  Yes Shane Crutchamachandran, Pradeep, MD    Family History History reviewed. No pertinent family history.  Social History Social History   Tobacco Use  . Smoking status: Former Smoker    Packs/day: 1.50    Years: 40.00    Pack years: 60.00    Last attempt to quit: 01/03/2006    Years since quitting: 12.9  . Smokeless tobacco: Never Used  Substance Use Topics  . Alcohol use: Yes  . Drug use: Never     Allergies   Patient has no known allergies.   Review of Systems Review of Systems  Constitutional: Negative for chills and fever.  HENT: Negative for congestion, sore throat and trouble swallowing.   Eyes: Negative for visual disturbance.  Respiratory: Positive for shortness of breath. Negative for cough.   Cardiovascular: Positive for leg swelling. Negative for chest pain.  Gastrointestinal: Negative for abdominal pain, constipation, diarrhea, nausea and vomiting.  Genitourinary: Negative for dysuria, flank pain, frequency and hematuria.  Musculoskeletal: Negative for back pain, myalgias and neck pain.  Skin: Negative for rash and wound.  Neurological: Negative for dizziness, weakness, light-headedness, numbness and headaches.  All other systems reviewed and are negative.    Physical Exam Updated Vital Signs BP (!) 157/81 (BP Location: Left Arm)   Pulse 65   Temp 97.8 F (36.6 C) (Oral)   Resp 20   Ht 5\' 3"  (1.6 m)   Wt 82.3 kg   SpO2 97%   BMI 32.13 kg/m   Physical Exam Vitals signs and nursing note reviewed.  Constitutional:      General: She is not in acute distress.    Appearance: Normal appearance. She is well-developed. She is not ill-appearing.  HENT:     Head: Normocephalic and atraumatic.     Nose: Nose normal. No congestion or rhinorrhea.     Mouth/Throat:     Mouth: Mucous membranes are moist.  Eyes:     Extraocular Movements: Extraocular movements intact.     Conjunctiva/sclera: Conjunctivae normal.     Pupils: Pupils are  equal, round, and reactive to light.  Neck:     Musculoskeletal: Normal range of motion and neck supple.  Cardiovascular:     Rate and Rhythm: Normal rate.     Pulses: Normal pulses.     Heart sounds: No murmur. No friction rub. No gallop.      Comments: Irregular Pulmonary:     Effort: Pulmonary effort is normal.     Comments: Mildly diminished breath sounds bilateral bases.  No respiratory distress. Abdominal:     General: Bowel sounds are normal.     Palpations: Abdomen is soft.     Tenderness: There is no abdominal tenderness. There is no guarding or rebound.  Musculoskeletal: Normal range of motion.        General: No tenderness.     Right lower leg: Edema present.     Left lower leg: Edema present.     Comments: 1+ bilateral lower extremity pitting  edema.  No asymmetry or tenderness.  Skin:    General: Skin is warm and dry.     Findings: No erythema or rash.  Neurological:     General: No focal deficit present.     Mental Status: She is alert and oriented to person, place, and time.     Comments: Moving all extremities without focal deficit.  Sensation fully intact.  Psychiatric:        Mood and Affect: Mood normal.        Behavior: Behavior normal.      ED Treatments / Results  Labs (all labs ordered are listed, but only abnormal results are displayed) Labs Reviewed  CBC WITH DIFFERENTIAL/PLATELET - Abnormal; Notable for the following components:      Result Value   WBC 11.2 (*)    Neutro Abs 8.0 (*)    All other components within normal limits  COMPREHENSIVE METABOLIC PANEL - Abnormal; Notable for the following components:   CO2 33 (*)    All other components within normal limits  TROPONIN I - Abnormal; Notable for the following components:   Troponin I 0.12 (*)    All other components within normal limits  BRAIN NATRIURETIC PEPTIDE - Abnormal; Notable for the following components:   B Natriuretic Peptide 225.3 (*)    All other components within normal  limits  URINALYSIS, ROUTINE W REFLEX MICROSCOPIC - Abnormal; Notable for the following components:   Color, Urine COLORLESS (*)    Specific Gravity, Urine 1.004 (*)    All other components within normal limits  BASIC METABOLIC PANEL - Abnormal; Notable for the following components:   Potassium 3.4 (*)    Glucose, Bld 115 (*)    All other components within normal limits  TROPONIN I - Abnormal; Notable for the following components:   Troponin I 0.11 (*)    All other components within normal limits  TROPONIN I - Abnormal; Notable for the following components:   Troponin I 0.11 (*)    All other components within normal limits  CBC    EKG EKG Interpretation  Date/Time:  Thursday November 27 2018 15:25:26 EST Ventricular Rate:  82 PR Interval:    QRS Duration: 82 QT Interval:  334 QTC Calculation: 390 R Axis:   20 Text Interpretation:  Atrial fibrillation ST & T wave abnormality, consider inferior ischemia Abnormal ECG Confirmed by Loren Racer ([id number]) on 11/27/2018 5:12:45 PM   Radiology No results found.  Procedures Procedures (including critical care time)  Medications Ordered in ED Medications  furosemide (LASIX) injection 40 mg (40 mg Intravenous Given 11/27/18 1732)     Initial Impression / Assessment and Plan / ED Course  I have reviewed the triage vital signs and the nursing notes.  Pertinent labs & imaging results that were available during my care of the patient were reviewed by me and considered in my medical decision making (see chart for details).    Evidence of fluid overload.  Mild elevation in troponin.  Discussed with internal medicine teaching service who will see patient in the emergency department and admit.  Given dose of IV Lasix and patient is diuresing well.   Final Clinical Impressions(s) / ED Diagnoses   Final diagnoses:  Acute on chronic congestive heart failure, unspecified heart failure type (HCC)  Elevated troponin    ED Discharge  Orders         Ordered    Increase activity slowly     11/28/18 1440  Diet - low sodium heart healthy     11/28/18 1440    Discharge instructions    Comments:  Ms. Seybold,   I am glad that you are feeling better.  You were hospitalized for a mild heart failure exacerbation and was found to have extra fluid in her lungs and legs. We treated you with Lasix to help get the fluid off.  Please continue you to eat a low-sodium diet and limit your fluid intake.   Please try to obtain a scale and weigh yourself daily, if your weight increases by more than 3 pounds overnight please take your home Lasix.  Please continue all of your home medications.  Please follow-up with your primary care provider within the next 1-2 weeks.  Please keep your cardiologist appointment. It was a pleasure taking care of you while you are in the hospital.  Have a great rest of the holiday.   11/28/18 1440    Call MD for:  temperature >100.4     11/28/18 1440    Call MD for:  persistant nausea and vomiting     11/28/18 1440    Call MD for:  severe uncontrolled pain     11/28/18 1440    Call MD for:  redness, tenderness, or signs of infection (pain, swelling, redness, odor or green/yellow discharge around incision site)     11/28/18 1440    Call MD for:  difficulty breathing, headache or visual disturbances     11/28/18 1440    Call MD for:  hives     11/28/18 1440    Call MD for:  persistant dizziness or light-headedness     11/28/18 1440    Call MD for:  extreme fatigue     11/28/18 1440    (HEART FAILURE PATIENTS) Call MD:  Anytime you have any of the following symptoms: 1) 3 pound weight gain in 24 hours or 5 pounds in 1 week 2) shortness of breath, with or without a dry hacking cough 3) swelling in the hands, feet or stomach 4) if you have to sleep on extra pillows at night in order to breathe.     11/28/18 1440           Loren Racer, MD 12/01/18 1740

## 2018-11-28 ENCOUNTER — Inpatient Hospital Stay (HOSPITAL_COMMUNITY): Payer: Medicare Other

## 2018-11-28 DIAGNOSIS — R778 Other specified abnormalities of plasma proteins: Secondary | ICD-10-CM | POA: Diagnosis present

## 2018-11-28 DIAGNOSIS — R0602 Shortness of breath: Secondary | ICD-10-CM

## 2018-11-28 DIAGNOSIS — R7989 Other specified abnormal findings of blood chemistry: Secondary | ICD-10-CM | POA: Diagnosis present

## 2018-11-28 DIAGNOSIS — J9611 Chronic respiratory failure with hypoxia: Secondary | ICD-10-CM | POA: Diagnosis present

## 2018-11-28 DIAGNOSIS — J449 Chronic obstructive pulmonary disease, unspecified: Secondary | ICD-10-CM | POA: Diagnosis present

## 2018-11-28 LAB — BASIC METABOLIC PANEL
Anion gap: 12 (ref 5–15)
BUN: 17 mg/dL (ref 8–23)
CO2: 31 mmol/L (ref 22–32)
Calcium: 8.9 mg/dL (ref 8.9–10.3)
Chloride: 101 mmol/L (ref 98–111)
Creatinine, Ser: 0.9 mg/dL (ref 0.44–1.00)
GFR calc Af Amer: >60 mL/min (ref >60)
GFR calc non Af Amer: >60 mL/min (ref >60)
Glucose, Bld: 115 mg/dL — ABNORMAL HIGH (ref 70–99)
Potassium: 3.4 mmol/L — ABNORMAL LOW (ref 3.5–5.1)
Sodium: 144 mmol/L (ref 135–145)

## 2018-11-28 LAB — ECHOCARDIOGRAM COMPLETE
Height: 63 in
Weight: 2902.4 oz

## 2018-11-28 LAB — CBC
HCT: 38.9 % (ref 36.0–46.0)
Hemoglobin: 12.1 g/dL (ref 12.0–15.0)
MCH: 29.3 pg (ref 26.0–34.0)
MCHC: 31.1 g/dL (ref 30.0–36.0)
MCV: 94.2 fL (ref 80.0–100.0)
Platelets: 324 10*3/uL (ref 150–400)
RBC: 4.13 MIL/uL (ref 3.87–5.11)
RDW: 13.7 % (ref 11.5–15.5)
WBC: 7.3 10*3/uL (ref 4.0–10.5)
nRBC: 0 % (ref 0.0–0.2)

## 2018-11-28 LAB — TROPONIN I
TROPONIN I: 0.11 ng/mL — AB (ref <0.03)
Troponin I: 0.11 ng/mL (ref <0.03)

## 2018-11-28 MED ORDER — POTASSIUM CHLORIDE CRYS ER 20 MEQ PO TBCR
30.0000 meq | EXTENDED_RELEASE_TABLET | Freq: Two times a day (BID) | ORAL | Status: DC
  Administered 2018-11-28: 30 meq via ORAL
  Filled 2018-11-28: qty 1

## 2018-11-28 NOTE — Progress Notes (Signed)
Patient ambulated from room to nursing station  O2 Sats with no Oxygen Therapy 93% O2 sats with oxygen therapy 98% on room air. Patient tolerated ambulation well. No shortness of breath or dyspnea.  Stanford BreedBracey, Watson Robarge N RN 2:36 PM 11-28-2018

## 2018-11-28 NOTE — Progress Notes (Signed)
Subjective: MS. Righter reports that she had been in [place] twice in the past several weeks to visit her family. Since arrival, she had been having worsening SOB even with ambulation to the bathroom. She lives with her son and experiences shortness of breath on awakening.  She reports of been hospitalized for HF exacerbation in September during a wedding and twice in December. She currently denies cough or sputum production. Also reports of nasal congestion with green nasal discharge. She does not have a weight at home but reports that her weight is stable ~181lbs. She reports improvement with shortness of breath since admission. [place] [facility] and she has a follow up with him in Jan 2020.  Patient is from [place] she lives with her son.  Her primary care physician is Marcelino DusterJohn Johnston.  She also has a pulmonologist that she is unsure what the name is.  We discussed the plan for today and she is in agreement.  Objective:  Vital signs in last 24 hours: Vitals:   11/27/18 2045 11/27/18 2104 11/27/18 2149 11/28/18 0038  BP:   (!) 160/76 (!) 163/79  Pulse: 69  65 73  Resp: 18  18 18   Temp:   (!) 97.5 F (36.4 C) 98.1 F (36.7 C)  TempSrc:   Oral Oral  SpO2: 96%  91% (!) 89%  Weight:  82.1 kg    Height:  5\' 3"  (1.6 m)      General: Well nourished, well appearing, NAD  Cardiac: Irregular rate and rhythm, normal S1, S2, no murmurs, rubs or gallops, +Hepatojugular reflux Pulmonary: Lungs CTA bilaterally, no wheezing, rhonchi or rales Abdomen: Soft, non-tender, +bowel sounds, no guarding or masses noted  Extremity: Trace bilateral LE edema, no muscle atrophy, no lesions or wounds noted  Psychiatry: Normal mood and affect     Assessment/Plan:  Active Problems:   CHF exacerbation (HCC)  This is a 72 year old female with history of COPD on oxygen at night, CHF last echo in July showed an EF of 50%, hyperlipidemia, hypertension, hypothyroidism, bilateral carotid artery  stenosis, atrial fibrillation on Eliquis, recently hospitalized [place] in [place] who presented with worsening shortness of breath and increased oxygen requirement.  Found to have vascular congestion and a mildly elevated BNP admission.  She received 40 mg IV Lasix in the ED and had good urinary output.  Acute on chronic heart failure: Patient may have had a mild exacerbation of heart failure, on exam today she appears mostly euvolemic, she does have a hepatojugular reflux and trace bilateral lower extremity edema, lungs appeared clear to auscultation.  On bedside ultrasound there was some B-lines seen with small pleural effusions.  She had a good net output of about 3000 mL.  Weight today is 181.4 which is similar to yesterday.  CBC and BMP have been unremarkable.  Her troponin was elevated initially at 0.12, has been stable at 0.11. She denied any chest pain, nausea, neck pain or any other symptoms.  She reports some improvement in her shortness of breath today.  She may have had a mild heart failure exacerbation because her shortness of breath but she does not appear to require any further diuresis now.  -Strict I's and O's, daily weights -Repeat -We will ambulate patient to evaluate oxygen status -No Lasix today -Possible she may be discharged today depending on how she feels later  COPD: Patient has a history of COPD, she does not report any wheezing, increased cough or worsening chest tightness.  She is Pulmicort twice daily, montelukast 10 mg, and Anoro Ellipta daily.  She is also on albuterol inhaler.  And DuoNeb as needed at home.  Patient is on oxygen at night.  She is currently 3 L nasal cannula.  Does not appear to be a COPD exacerbation. -Continue montelukast 10 mg daily -Continue Anoro Ellipta 1 puff daily -Duo nebs every 4 PRN -Oxygen with ambulation  Atrial fibrillation: Patient has history, is currently rate controlled now.  She has been on Eliquis 5 mg twice daily and digoxin  125 mcg daily at home.  This is continue admission.  She denies any symptoms today. -Continue Eliquis 5 mg twice daily -Continue digoxin 125 mcg daily  Hypertension: Patient has a history of hypertension, she is on hydralazine and diltiazem at home.  She does have some hypertension today.  -Continue hydralazine 25 mg every 8 hours -Continue diltiazem 300 milligrams daily  FEN: No fluids, replete lytes prn, Heart healthy diet  VTE ppx: Eliquis Code Status: FULL   Dispo: Anticipated discharge in approximately 0-1 day(s).   Claudean SeveranceKrienke, Ayasha Ellingsen M, MD 11/28/2018, 6:25 AM Pager: [phone]

## 2018-11-28 NOTE — Discharge Summary (Signed)
Name: Christina Lozano MRN: [id number] DOB: 09-13-1946 72 y.o. PCP: Christina Lozano, John D, MD  Date of Admission: 11/27/2018  3:22 PM Date of Discharge: 11/28/18 Attending Physician: Tyson AliasVincent, Duncan Thomas, *  Discharge Diagnosis: 1. Acute on chronic diastolic heart failure 2. COPD 3. Atrial fibrillation 4. Hypertension  Discharge Medications: Allergies as of 11/28/2018   No Known Allergies     Medication List    TAKE these medications   albuterol 108 (90 Base) MCG/ACT inhaler Commonly known as:  PROVENTIL HFA;VENTOLIN HFA Inhale 2 puffs into the lungs every 4 (four) hours as needed for wheezing or shortness of breath.   budesonide 0.25 MG/2ML nebulizer solution Commonly known as:  PULMICORT Inhale 2 mLs into the lungs 2 (two) times daily as needed (shortness of breath).   digoxin 0.125 MG tablet Commonly known as:  LANOXIN Take 1 tablet by mouth daily.   diltiazem 300 MG 24 hr capsule Commonly known as:  CARDIZEM CD Take 300 mg by mouth daily.   ELIQUIS 5 MG Tabs tablet Generic drug:  apixaban Take 5 mg by mouth 2 (two) times daily.   furosemide 40 MG tablet Commonly known as:  LASIX Take 40 mg by mouth daily.   hydrALAZINE 25 MG tablet Commonly known as:  APRESOLINE Take 25 mg by mouth every 8 (eight) hours.   ipratropium-albuterol 0.5-2.5 (3) MG/3ML Soln Commonly known as:  DUONEB Inhale 3 mLs into the lungs every 4 (four) hours as needed (short of breath).   Magnesium Oxide 400 MG Caps Take 400 mg by mouth daily.   montelukast 10 MG tablet Commonly known as:  SINGULAIR Take 10 mg by mouth daily.   NATURE-THROID 48.75 MG Tabs Generic drug:  Thyroid Take 1 tablet by mouth daily.   potassium chloride 10 MEQ tablet Commonly known as:  K-DUR Take 10 mEq by mouth daily.   pravastatin 10 MG tablet Commonly known as:  PRAVACHOL Take 10 mg by mouth at bedtime.   sertraline 50 MG tablet Commonly known as:  ZOLOFT Take 50 mg by mouth at bedtime.     umeclidinium-vilanterol 62.5-25 MCG/INH Aepb Commonly known as:  ANORO ELLIPTA Inhale 1 puff into the lungs daily.       Disposition and follow-up:   Ms.Christ[facility].  At the hospital follow up visit please address:  1.  AoCHF: Please assess volume status. Patient was advised to get scale and take her lasix if weight was up 3 lbs. Discharge weight was 181.4 lb.  Atrial fibrillation: Please make sure patient is taking her medications as prescribed.  Hypertension: No changes to her home medications  2.  Labs / imaging needed at time of follow-up: BMP  3.  Pending labs/ test needing follow-up: None  Follow-up Appointments: Follow-up Information    Christina Lozano, John D, MD. Schedule an appointment as soon as possible for a visit in 1 week(s).   Specialty:  Internal Medicine Contact information: [address] [place] [address] [phone]           Hospital Course by problem list: 1. Acute on chronic diastolic heart failure: This is a 72 year old female with history of COPD on oxygen at night, CHF last echo in July showed an EF of 50%, hyperlipidemia, hypertension, hypothyroidism, bilateral carotid artery stenosis, atrial fibrillation on Eliquis, [place] who presented with worsening shortness of breath and increased oxygen requirement.  Found to have vascular congestion on  CXR and a mildly elevated BNP admission. She had not been taking her home lasix. Mild elevation in trop to 0.12, repeat trop was flat at .11. EKG showed some T wave inversions in inferolateral leads, thought to be related to demand ischemia. She received 40 mg IV Lasix in the ED and had good urinary output. Patient appeared to be very mildly fluid overloaded on exam the next day but reported significant improvement in her breathing. Echocardiogram showed 55-60% EF, normal biventricular function, mod  basal septal hypertrophy, and a small and collapsible IVC. She was ambulated prior to discharge with no drop in her O2 sat. Patient was given instructions on how to take her home lasix.   2. COPD: She is Pulmicort twice daily, montelukast 10 mg, and Anoro Ellipta daily at home. This was continued on admission. Did not appear to have COPD exacerbation.   3. Atrial fibrillation: Was rate controlled during admission. Continued home eliquis 5 mg BID, diltiazem 300 mg daily, and digoxin 125 mcg daily.   4. Hypertension: Home medications were continued on admission.   Discharge Vitals:   BP (!) 157/81 (BP Location: Left Arm)   Pulse 65   Temp 97.8 F (36.6 C) (Oral)   Resp 20   Ht 5\' 3"  (1.6 m)   Wt 82.3 kg   SpO2 97%   BMI 32.13 kg/m   Pertinent Labs, Studies, and Procedures:  BMP Latest Ref Rng & Units 11/28/2018 11/27/2018  Glucose 70 - 99 mg/dL 161(W115(H) 90  BUN 8 - 23 mg/dL 17 19  Creatinine 9.600.44 - 1.00 mg/dL 4.540.90 0.980.89  Sodium 119135 - 145 mmol/L 144 140  Potassium 3.5 - 5.1 mmol/L 3.4(L) 4.0  Chloride 98 - 111 mmol/L 101 102  CO2 22 - 32 mmol/L 31 33(H)  Calcium 8.9 - 10.3 mg/dL 8.9 9.6   CBC Latest Ref Rng & Units 11/28/2018 11/27/2018  WBC 4.0 - 10.5 K/uL 7.3 11.2(H)  Hemoglobin 12.0 - 15.0 g/dL 14.712.1 82.912.0  Hematocrit 56.236.0 - 46.0 % 38.9 38.7  Platelets 150 - 400 K/uL 324 360   BNP: 225.3 Trop: 0.12, 0.11  CXR: increased vascular congestion, right sided pleural effusion  Discharge Instructions: Discharge Instructions    (HEART FAILURE PATIENTS) Call MD:  Anytime you have any of the following symptoms: 1) 3 pound weight gain in 24 hours or 5 pounds in 1 week 2) shortness of breath, with or without a dry hacking cough 3) swelling in the hands, feet or stomach 4) if you have to sleep on extra pillows at night in order to breathe.   Complete by:  As directed    Call MD for:  difficulty breathing, headache or visual disturbances   Complete by:  As directed    Call MD for:   extreme fatigue   Complete by:  As directed    Call MD for:  hives   Complete by:  As directed    Call MD for:  persistant dizziness or light-headedness   Complete by:  As directed    Call MD for:  persistant nausea and vomiting   Complete by:  As directed    Call MD for:  redness, tenderness, or signs of infection (pain, swelling, redness, odor or green/yellow discharge around incision site)   Complete by:  As directed    Call MD for:  severe uncontrolled pain   Complete by:  As directed    Call MD for:  temperature >100.4   Complete by:  As directed  Diet - low sodium heart healthy   Complete by:  As directed    Discharge instructions   Complete by:  As directed    Ms. Milroy,   I am glad that you are feeling better.  You were hospitalized for a mild heart failure exacerbation and was found to have extra fluid in her lungs and legs. We treated you with Lasix to help get the fluid off.  Please continue you to eat a low-sodium diet and limit your fluid intake.   Please try to obtain a scale and weigh yourself daily, if your weight increases by more than 3 pounds overnight please take your home Lasix.  Please continue all of your home medications.  Please follow-up with your primary care provider within the next 1-2 weeks.  Please keep your cardiologist appointment. It was a pleasure taking care of you while you are in the hospital.  Have a great rest of the holiday.   Increase activity slowly   Complete by:  As directed       Signed: Claudean Severance, MD 11/28/2018, 2:40 PM   Pager: [phone]

## 2018-11-28 NOTE — Progress Notes (Signed)
  Echocardiogram 2D Echocardiogram has been performed.  Christina SavoyCasey N Jemarion Lozano 11/28/2018, 11:21 AM

## 2018-11-28 NOTE — Progress Notes (Signed)
Troponin 0.11, was 0.12, no s/s, will continue to monitor, Thanks Lavonda JumboMike  F RN

## 2018-11-28 NOTE — Progress Notes (Signed)
Discharge instructions reviewed with patient, questions and concerns answered, patient does to follow-up with PCP on Jan.6th 2020 Stanford BreedBracey, Resha Filippone N RN 5:04 PM 11-28-2018

## 2018-12-09 ENCOUNTER — Encounter: Payer: Federal, State, Local not specified - PPO | Attending: Internal Medicine

## 2018-12-10 ENCOUNTER — Telehealth: Payer: Self-pay | Admitting: Internal Medicine

## 2018-12-10 NOTE — Telephone Encounter (Signed)
[facility] received medical records from [facility] - forwarding via interoffice mail to Dr. Nicholos Johns at [facility] 12/10/2018  LM

## 2019-12-08 IMAGING — DX DG CHEST 2V
2 series · 2 of 2 positions shown · non-contrast
Comparison: None available. Report only from radiographs at
[REDACTED] 06/11/2018.

CLINICAL DATA: Shortness of breath. Recently hospitalized for
pneumonia. History of hypertension, COPD, congestive heart failure
and atrial fibrillation.

EXAM:
CHEST - 2 VIEW

[w chest pa]
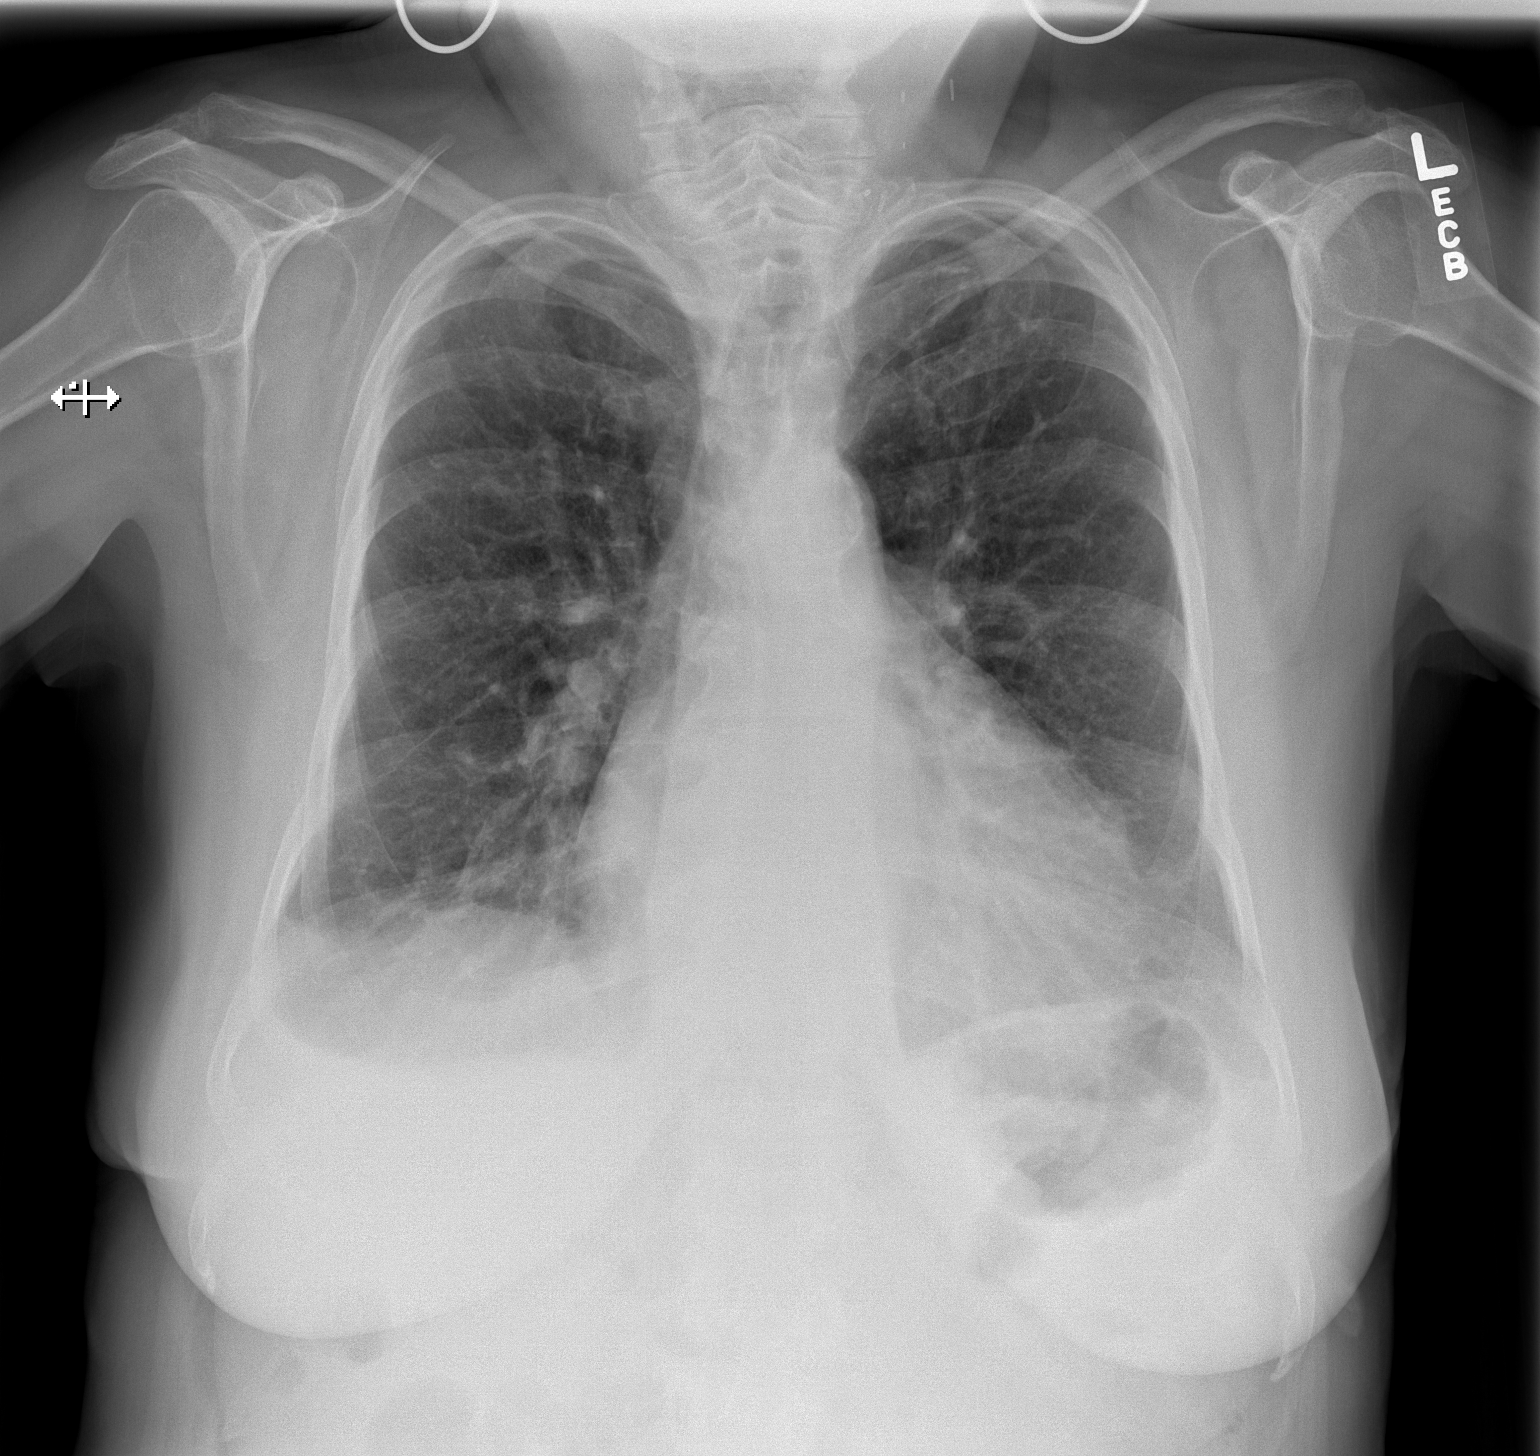

[w chest lat]
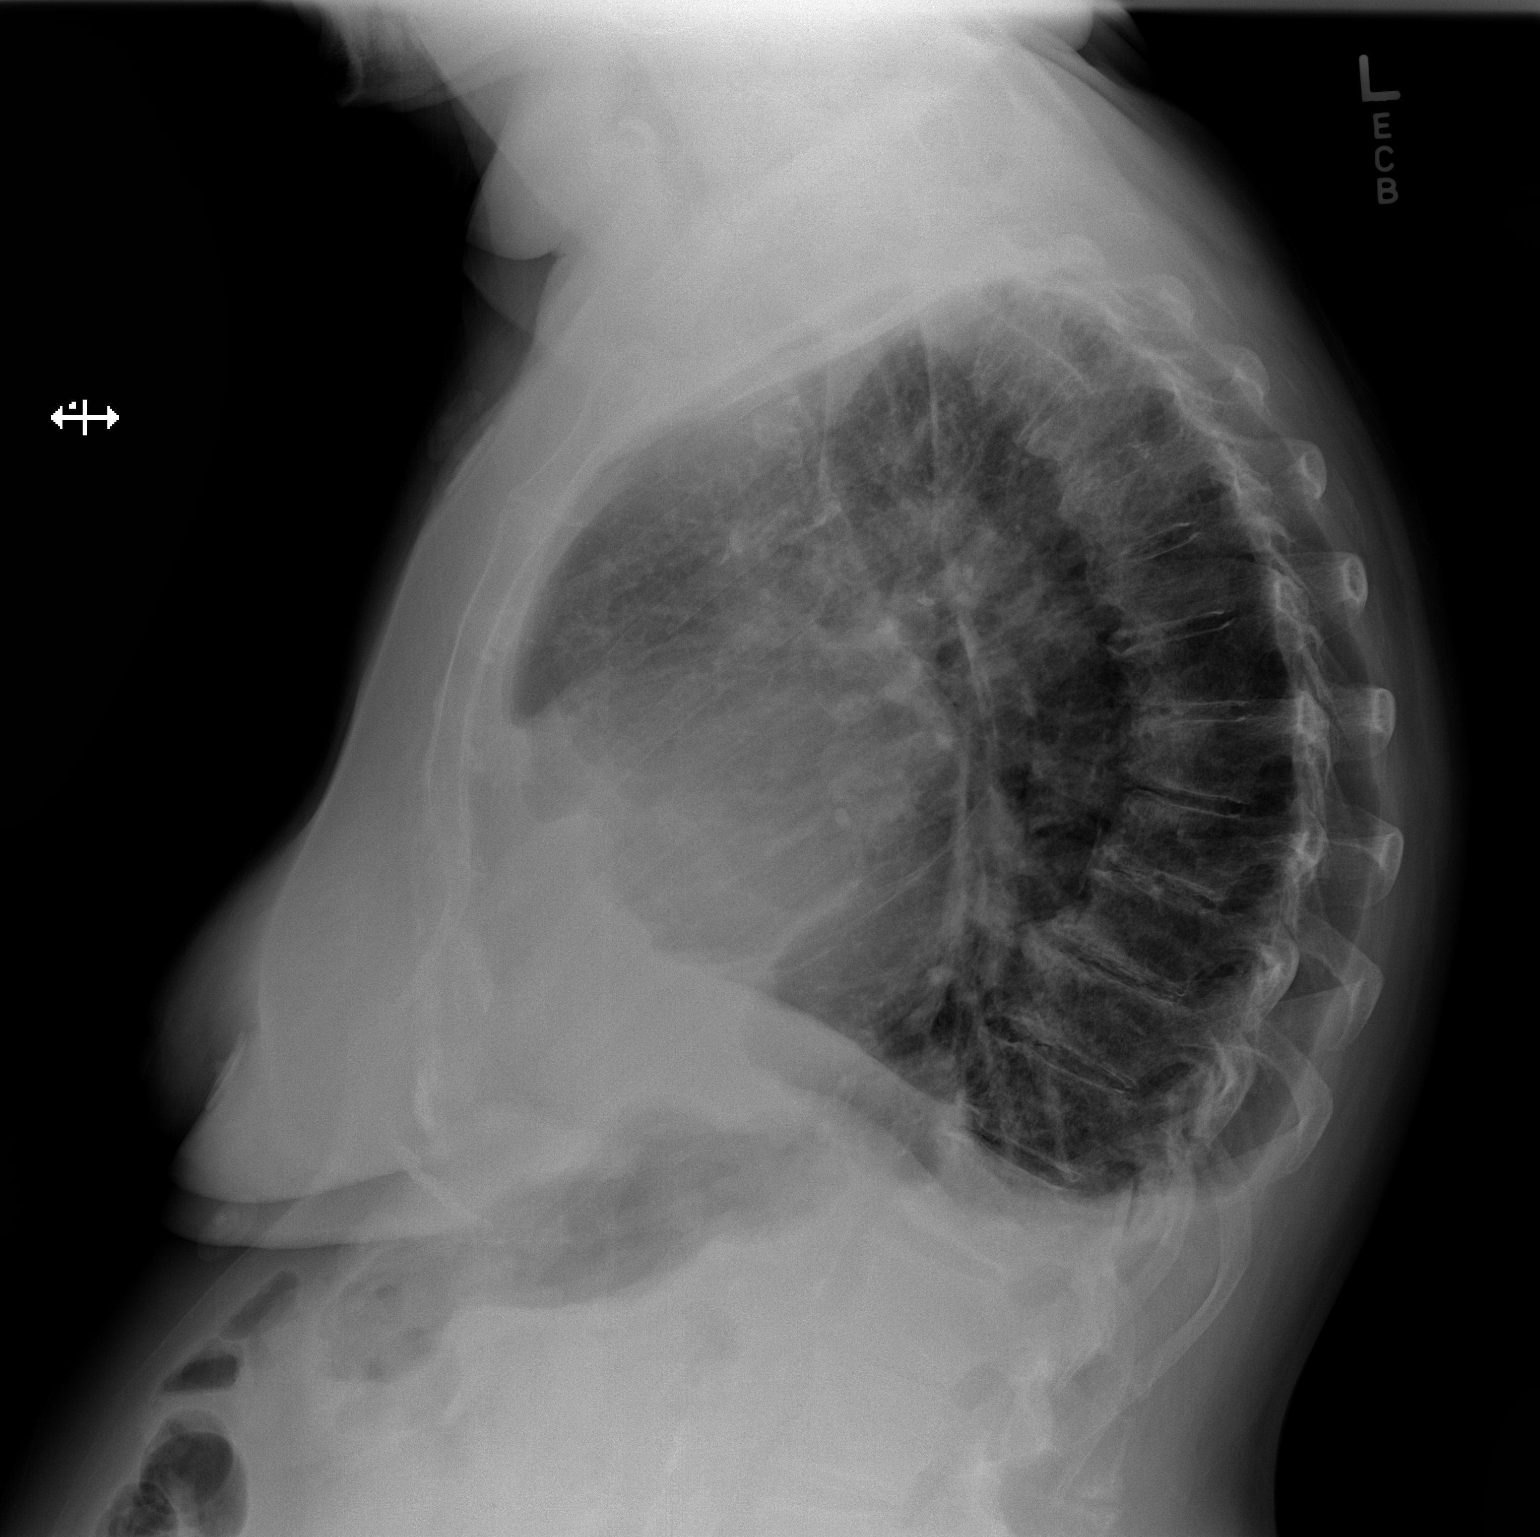

[2 of 2 positions shown; findings below may reference images not displayed]

FINDINGS: Mild cardiomegaly with aortic atherosclerosis. There is vascular
congestion without overt pulmonary edema or confluent airspace
opacity. Both costophrenic angles are blunted posteriorly,
suspicious for small bilateral pleural effusions. There are surgical
clips in the left neck. Degenerative changes are present throughout
the thoracic spine.
IMPRESSION: Mild cardiomegaly with vascular congestion and probable small
bilateral pleural effusions. No focal airspace disease.
